# Patient Record
Sex: Male | Born: 1945 | Race: White | Hispanic: No | Marital: Married | Smoking: Former smoker
Health system: Southern US, Community
[De-identification: ages and names within clinical notes are randomized; demographics above are authoritative.]

## PROBLEM LIST (undated history)

## (undated) DIAGNOSIS — Z978 Presence of other specified devices: Secondary | ICD-10-CM

## (undated) DIAGNOSIS — E785 Hyperlipidemia, unspecified: Secondary | ICD-10-CM

## (undated) DIAGNOSIS — N400 Enlarged prostate without lower urinary tract symptoms: Secondary | ICD-10-CM

## (undated) DIAGNOSIS — K219 Gastro-esophageal reflux disease without esophagitis: Secondary | ICD-10-CM

## (undated) DIAGNOSIS — I10 Essential (primary) hypertension: Secondary | ICD-10-CM

## (undated) DIAGNOSIS — M1990 Unspecified osteoarthritis, unspecified site: Secondary | ICD-10-CM

## (undated) DIAGNOSIS — R972 Elevated prostate specific antigen [PSA]: Secondary | ICD-10-CM

## (undated) DIAGNOSIS — Z960 Presence of urogenital implants: Secondary | ICD-10-CM

## (undated) DIAGNOSIS — R339 Retention of urine, unspecified: Secondary | ICD-10-CM

## (undated) HISTORY — PX: INGUINAL HERNIA REPAIR: SUR1180

---

## 2001-02-28 ENCOUNTER — Emergency Department (HOSPITAL_COMMUNITY): Admission: EM | Admit: 2001-02-28 | Discharge: 2001-02-28 | Payer: Self-pay | Admitting: Emergency Medicine

## 2002-04-02 ENCOUNTER — Ambulatory Visit (HOSPITAL_COMMUNITY): Admission: RE | Admit: 2002-04-02 | Discharge: 2002-04-02 | Payer: Self-pay | Admitting: Internal Medicine

## 2002-11-30 ENCOUNTER — Other Ambulatory Visit: Admission: RE | Admit: 2002-11-30 | Discharge: 2002-11-30 | Payer: Self-pay | Admitting: Dermatology

## 2005-01-21 ENCOUNTER — Other Ambulatory Visit: Admission: RE | Admit: 2005-01-21 | Discharge: 2005-01-21 | Payer: Self-pay | Admitting: Dermatology

## 2014-07-15 HISTORY — PX: FOOT SURGERY: SHX648

## 2014-07-15 HISTORY — PX: TOTAL HIP ARTHROPLASTY: SHX124

## 2016-07-09 HISTORY — PX: TOTAL KNEE ARTHROPLASTY: SHX125

## 2016-08-26 ENCOUNTER — Other Ambulatory Visit: Payer: Self-pay | Admitting: Urology

## 2016-08-29 ENCOUNTER — Other Ambulatory Visit (HOSPITAL_COMMUNITY): Payer: Self-pay | Admitting: Internal Medicine

## 2016-08-29 DIAGNOSIS — N400 Enlarged prostate without lower urinary tract symptoms: Secondary | ICD-10-CM

## 2016-09-03 ENCOUNTER — Encounter (HOSPITAL_BASED_OUTPATIENT_CLINIC_OR_DEPARTMENT_OTHER): Payer: Self-pay | Admitting: *Deleted

## 2016-09-03 NOTE — Progress Notes (Signed)
SPOKE W/ PT'S WIFE.  NPO AFTER MN.  ARRIVE AT 0830.  NEEDS ISTAT AND EKG.  WILL TAKE ATENOLOL AM DOS W/ SIPS OF WATER.

## 2016-09-09 NOTE — H&P (Signed)
Office Visit Report 08/21/2016    Benjamin Mayer         MRN: [id number]  PRIMARY CARE:  Benjamin Mayer. Lajuana Ripple, MD  DOB: [birth year], 71 year old Male  REFERRING:  [name], NP  SSN: -[id number]  PROVIDER:  Jerilee Field, M.D.    LOCATION:  [facility] [phone]    CC: I have urinary retention.  HPI: Benjamin Mayer is a 71 year-old male established patient who is here for urinary retention.  His problem was diagnosed 07/09/2016. His current symptoms did begin after he had a surgical procedure. The surgery he had done was Knee replacement. His urinary retention is being treated with foley catheter and flomax. Patient denies suprapubic tube, intemittent catheterization, hytrin, cardura, uroxatrol, rapaflo, avodart, and proscar.   Denies constipation. Remains on Tamsulsoin 0.4 mg 2 po daily. UDS showed a bladder capacity of 210 cc. He had bladder outlet obstruction on voiding with an quiet EMG. Urine culture did grow Klebsiella sensitive to Cipro. He does have a Foley.   He returns for cysto/void trial, but also to discuss management of retention and BPH.     CC: BPH  HPI: The patient complains of lower urinary tract symptom(s) that include frequency and nocturia. His symptoms have been stable over the last year. He denies any other associated symptoms.   Pt with 70 g prostate in 2010 TRUS. Prostate 98 grams in 2016.      CC: Elevated PSA-Established Patient  HPI: His last PSA was performed 06/18/2016. The last PSA value was 13.9. The patient states he does not take 5 alpha reductase inhibitor medication.   He has had a prostate biopsy done. Patient does not have a family history of prostate cancer.   Elevated PSA and prostate biopsy:  -Aug 2008 PSA 4.8  -Sep 2009 PSA 6.3  -Jul 2010 PSA 8.1  -Sept 2010 TRUS Prostate biopsy, 70 g prostate, benign with acute and chronic inflammation  -May 2011 PSA 6.38  -Nov 2012 PSA 5.24  --May 2014 PSA 7.15, with 25% free  --May  2015 PSA 8.77, normal DRE  --Aug 2015 PSA 8.11, 25% free  --Mar 2016 PSA 8.41, 23% free  --Jul 2016 prostate 98 grams, PSA 8.48 with 24% free  --Mar 2017 PSA 9.32 with 27% free; normal DRE  --Jun 2017 7.24   Planned f/u after knee replacement, but pt went into retention.     ALLERGIES: Penicillins - Vomiting, Nausea    MEDICATIONS: Aspirin 81 MG TABS 2 Oral Daily  Atenolol 25 mg tablet Oral  Celecoxib  Simvastatin 20 mg tablet Oral  Tylenol Extra Strength 2 PO Q 6 H     GU PSH: Complex cystometrogram, w/ void pressure and urethral pressure profile studies, any technique - 08/05/2016 Complex Uroflow - 08/05/2016 Emg surf Electrd - 08/05/2016 Inject For cystogram - 08/05/2016 Intrabd voidng Press - 08/05/2016    NON-GU PSH: Extensive Foot Surgery Hernia Repair Total Hip Replacement - 01/19/2015 Total Knee Replacement, Right - 07/09/2016        GU PMH: Urinary Retention (Worsening, Chronic), Will remain on Tamsulosin 0.4 mg 2 po daily. Instructed to push po fluids. If unable to void in 6 hrs RTC for PVR and possible foley replacement vs CIC teaching. Pt returned this pm unable to void. Pt does not want to be taught CIC. Will replace foley and proceed with UDS and cysto w/Dr. Mena Goes - 07/16/2016 BPH w/o LUTS - 06/25/2016, BPH without obstruction/lower  urinary tract symptoms, - 01/19/2015 Elevated PSA - 06/25/2016, - 12/25/2015, Elevated prostate specific antigen (PSA), - 09/19/2015 BPH w/LUTS - 12/25/2015, Benign prostatic hyperplasia with urinary obstruction, - 09/19/2015 Urinary Frequency, Increased urinary frequency - 09/19/2015 Inflammatory Disease Prostate, Unspec, Prostatitis - 2014 Urinary Tract Inf, Unspec site, Pyuria - 2014    NON-GU PMH: Encounter for general adult medical examination without abnormal findings, Encounter for preventive health examination - 09/19/2015 Personal history of other diseases of the circulatory system, History of hypertension - 2014 Personal history of other  endocrine, nutritional and metabolic disease, History of hypercholesterolemia - 2014    FAMILY HISTORY: Acute Myocardial Infarction - Mother Aortic Aneurysm - Father Breast Cancer - Mother Death In The Family Father - Father Death In The Family Mother - Mother Family Health Status Number - Runs In Family Heart Disease - Father Hypertension - Mother   SOCIAL HISTORY: Marital Status: Married Current Smoking Status: Patient does not smoke anymore. Has not smoked since 07/16/1978.  <DIV'  Tobacco Use Assessment Completed:  Used Tobacco in last 30 days?   Has never drank.  Drinks 2 caffeinated drinks per day.    REVIEW OF SYSTEMS:     GU Review Male:  Patient denies frequent urination, hard to postpone urination, burning/ pain with urination, get up at night to urinate, leakage of urine, stream starts and stops, trouble starting your stream, have to strain to urinate , erection problems, and penile pain.    Gastrointestinal (Upper):  Patient denies nausea, vomiting, and indigestion/ heartburn.    Gastrointestinal (Lower):  Patient denies diarrhea and constipation.    Constitutional:  Patient denies fever, night sweats, weight loss, and fatigue.    Skin:  Patient denies skin rash/ lesion and itching.    Eyes:  Patient denies blurred vision and double vision.    Ears/ Nose/ Throat:  Patient denies sore throat and sinus problems.    Hematologic/Lymphatic:  Patient denies swollen glands and easy bruising.    Cardiovascular:  Patient denies leg swelling and chest pains.    Respiratory:  Patient denies cough and shortness of breath.    Endocrine:  Patient denies excessive thirst.    Musculoskeletal:  Patient denies back pain and joint pain.    Neurological:  Patient denies headaches and dizziness.    Psychologic:  Patient denies depression and anxiety.    VITAL SIGNS: None     MULTI-SYSTEM PHYSICAL EXAMINATION:      Constitutional: Well-nourished. No physical deformities. Normally developed.  Good grooming.     Neck: Neck symmetrical, not swollen. Normal tracheal position.     Respiratory: No labored breathing, no use of accessory muscles.      Cardiovascular: Normal temperature, normal extremity pulses, no swelling, no varicosities.     Skin: No paleness, no jaundice, no cyanosis. No lesion, no ulcer, no rash.     Neurologic / Psychiatric: Oriented to time, oriented to place, oriented to person. No depression, no anxiety, no agitation.            PAST DATA REVIEWED:   X-Ray Review: Prostate Ultrasound: Reviewed Films.      06/18/16 12/18/15 09/13/15 01/13/15 09/13/14 02/19/14 11/20/13 11/20/12  PSA  Total PSA 13.90  7.24  9.32  8.48  8.41  8.11  8.77  7.15   Free PSA  1.72  2.51  2.01  1.97  2.02   1.77   % Free PSA  [id number]  27  24  23  25    25  PROCEDURES:    Flexible Cystoscopy - 52000  Risks, benefits, and some of the potential complications of the procedure were discussed with the patient. All questions were answered. Informed consent was obtained. Antibiotic prophylaxis was given -- Cipro. Sterile technique and intraurethral analgesia were used.  Meatus:  Normal size. Normal location. Normal condition.  Urethra:  No strictures.  External Sphincter:  Normal.  Verumontanum:  Normal.  Prostate:  Obstructing. Moderate hyperplasia.  Bladder Neck:  Non-obstructing.  Ureteral Orifices:  Normal location. Normal size. Normal shape. Effluxed clear urine.  Bladder:  Mild trabeculation. Erythematous mucosa - posterior. No tumors. No stones.      The lower urinary tract was carefully examined. The procedure was well-tolerated and without complications. Antibiotic instructions were given. Instructions were given to call the office immediately for bloody urine, difficulty urinating, painful urination, fever, chills, nausea, vomiting or other illness. The patient stated that he understood these instructions and would comply with them.    Notes:  filled with 200  ml; he voided 100 ml with a slow flow.    ASSESSMENT:     ICD-10 Details  1 GU:  Urinary Retention, Unspec - R33.9   2  BPH w/o LUTS - N40.0   3  Elevated PSA - R97.20    PLAN:   Schedule  Return Visit/Planned Activity: Keep Scheduled Appointment - Office Visit, Schedule Surgery  Document  Letter(s):  Created for Patient: Clinical Summary   Notes:  BPH - on tamsulosin - discussed 5ARI addition. We also discussed TURP, laser tx's and PAE. Discussed I discussed llaser vaporization of prostate including side effects of the procedure, expected post-op course and likelihood of success. We discussed flow symptoms and irritative symptoms typically improve, but frequency and urgency can persist and rarely worsen. We also discussed risk of bleeding, infection, stricture, sexual dysfunction and incontinence among others. All questions answered. He elects to proceed.   retention - void trial equivocal. Discussed CIC. He said no. He's been asked twice.   PSA - elevation - we'll add a biopsy.   cc: Dr. Lajuana Ripple    * Signed by Jerilee Field, M.D. on 08/21/16 at 8:56 PM (EST)*   The information contained in this medical record document is considered private and confidential patient information. This information can only be used for the medical diagnosis and/or medical services that are being provided by the patient's selected caregivers. This information can only be distributed outside of the patient's care if the patient agrees and signs waivers of authorization for this information to be sent to an outside source or route.  Add: Pt went back to ED in retention and foley replaced. Will start Levaquin 2/26.

## 2016-09-10 ENCOUNTER — Ambulatory Visit (HOSPITAL_BASED_OUTPATIENT_CLINIC_OR_DEPARTMENT_OTHER)
Admission: RE | Admit: 2016-09-10 | Discharge: 2016-09-10 | Disposition: A | Discharge status: Home / Self Care | Payer: Medicare Other | Source: Ambulatory Visit | Attending: Urology | Admitting: Urology

## 2016-09-10 ENCOUNTER — Ambulatory Visit (HOSPITAL_BASED_OUTPATIENT_CLINIC_OR_DEPARTMENT_OTHER): Payer: Medicare Other | Admitting: Anesthesiology

## 2016-09-10 ENCOUNTER — Ambulatory Visit (HOSPITAL_COMMUNITY)
Admission: RE | Admit: 2016-09-10 | Discharge: 2016-09-10 | Disposition: A | Discharge status: Home / Self Care | Payer: Medicare Other | Source: Ambulatory Visit | Attending: Internal Medicine | Admitting: Internal Medicine

## 2016-09-10 ENCOUNTER — Encounter (HOSPITAL_BASED_OUTPATIENT_CLINIC_OR_DEPARTMENT_OTHER)
Admission: RE | Disposition: A | Discharge status: Home / Self Care | Payer: Self-pay | Source: Ambulatory Visit | Attending: Urology

## 2016-09-10 ENCOUNTER — Encounter (HOSPITAL_BASED_OUTPATIENT_CLINIC_OR_DEPARTMENT_OTHER): Payer: Self-pay | Admitting: Certified Registered"

## 2016-09-10 DIAGNOSIS — Z87891 Personal history of nicotine dependence: Secondary | ICD-10-CM | POA: Insufficient documentation

## 2016-09-10 DIAGNOSIS — R972 Elevated prostate specific antigen [PSA]: Secondary | ICD-10-CM | POA: Diagnosis not present

## 2016-09-10 DIAGNOSIS — Z7982 Long term (current) use of aspirin: Secondary | ICD-10-CM | POA: Insufficient documentation

## 2016-09-10 DIAGNOSIS — Z888 Allergy status to other drugs, medicaments and biological substances status: Secondary | ICD-10-CM | POA: Diagnosis not present

## 2016-09-10 DIAGNOSIS — N401 Enlarged prostate with lower urinary tract symptoms: Secondary | ICD-10-CM | POA: Diagnosis present

## 2016-09-10 DIAGNOSIS — Z880 Allergy status to penicillin: Secondary | ICD-10-CM | POA: Diagnosis not present

## 2016-09-10 DIAGNOSIS — Z79899 Other long term (current) drug therapy: Secondary | ICD-10-CM | POA: Insufficient documentation

## 2016-09-10 DIAGNOSIS — R338 Other retention of urine: Secondary | ICD-10-CM | POA: Insufficient documentation

## 2016-09-10 DIAGNOSIS — I10 Essential (primary) hypertension: Secondary | ICD-10-CM | POA: Diagnosis not present

## 2016-09-10 DIAGNOSIS — N400 Enlarged prostate without lower urinary tract symptoms: Secondary | ICD-10-CM

## 2016-09-10 DIAGNOSIS — Z886 Allergy status to analgesic agent status: Secondary | ICD-10-CM | POA: Insufficient documentation

## 2016-09-10 HISTORY — DX: Unspecified osteoarthritis, unspecified site: M19.90

## 2016-09-10 HISTORY — DX: Presence of other specified devices: Z97.8

## 2016-09-10 HISTORY — DX: Essential (primary) hypertension: I10

## 2016-09-10 HISTORY — DX: Gastro-esophageal reflux disease without esophagitis: K21.9

## 2016-09-10 HISTORY — DX: Retention of urine, unspecified: R33.9

## 2016-09-10 HISTORY — DX: Benign prostatic hyperplasia without lower urinary tract symptoms: N40.0

## 2016-09-10 HISTORY — DX: Hyperlipidemia, unspecified: E78.5

## 2016-09-10 HISTORY — DX: Elevated prostate specific antigen (PSA): R97.20

## 2016-09-10 HISTORY — PX: THULIUM LASER TURP (TRANSURETHRAL RESECTION OF PROSTATE): SHX6744

## 2016-09-10 HISTORY — PX: PROSTATE BIOPSY: SHX241

## 2016-09-10 HISTORY — DX: Presence of urogenital implants: Z96.0

## 2016-09-10 LAB — POCT I-STAT 4, (NA,K, GLUC, HGB,HCT)
GLUCOSE: 90 mg/dL (ref 65–99)
HEMATOCRIT: 57 % — AB (ref 39.0–52.0)
Hemoglobin: 19.4 g/dL — ABNORMAL HIGH (ref 13.0–17.0)
Potassium: 4.4 mmol/L (ref 3.5–5.1)
SODIUM: 140 mmol/L (ref 135–145)

## 2016-09-10 SURGERY — THULIUM LASER TURP (TRANSURETHRAL RESECTION OF PROSTATE)
Anesthesia: General | Site: Prostate

## 2016-09-10 MED ORDER — CEPHALEXIN 500 MG PO CAPS: 500.0000 mg | ORAL_CAPSULE | Freq: Every day | ORAL | 0 refills | Status: AC

## 2016-09-10 MED ORDER — MIDAZOLAM HCL 5 MG/5ML IJ SOLN
INTRAMUSCULAR | Status: DC | PRN
  Administered 2016-09-10 (×2): 1 mg via INTRAVENOUS

## 2016-09-10 MED ORDER — LACTATED RINGERS IV SOLN
INTRAVENOUS | Status: DC
  Administered 2016-09-10: 09:00:00 via INTRAVENOUS
  Filled 2016-09-10: qty 1000

## 2016-09-10 MED ORDER — PROMETHAZINE HCL 25 MG/ML IJ SOLN
6.2500 mg | INTRAMUSCULAR | Status: DC | PRN
  Filled 2016-09-10: qty 1

## 2016-09-10 MED ORDER — FENTANYL CITRATE (PF) 100 MCG/2ML IJ SOLN
INTRAMUSCULAR | Status: AC
  Filled 2016-09-10: qty 2

## 2016-09-10 MED ORDER — BELLADONNA ALKALOIDS-OPIUM 16.2-60 MG RE SUPP
RECTAL | Status: DC | PRN
  Administered 2016-09-10: 1 via RECTAL

## 2016-09-10 MED ORDER — DEXAMETHASONE SODIUM PHOSPHATE 4 MG/ML IJ SOLN
INTRAMUSCULAR | Status: DC | PRN
  Administered 2016-09-10: 10 mg via INTRAVENOUS

## 2016-09-10 MED ORDER — ACETAMINOPHEN 10 MG/ML IV SOLN
INTRAVENOUS | Status: AC
  Filled 2016-09-10: qty 100

## 2016-09-10 MED ORDER — ASPIRIN EC 81 MG PO TBEC: 81.0000 mg | DELAYED_RELEASE_TABLET | Freq: Every morning | ORAL | Status: AC

## 2016-09-10 MED ORDER — HYDROMORPHONE HCL 1 MG/ML IJ SOLN
0.2500 mg | INTRAMUSCULAR | Status: DC | PRN
  Filled 2016-09-10: qty 0.5

## 2016-09-10 MED ORDER — LIDOCAINE 2% (20 MG/ML) 5 ML SYRINGE
INTRAMUSCULAR | Status: AC
  Filled 2016-09-10: qty 5

## 2016-09-10 MED ORDER — DEXAMETHASONE SODIUM PHOSPHATE 10 MG/ML IJ SOLN
INTRAMUSCULAR | Status: AC
  Filled 2016-09-10: qty 1

## 2016-09-10 MED ORDER — ONDANSETRON HCL 4 MG/2ML IJ SOLN
INTRAMUSCULAR | Status: AC
  Filled 2016-09-10: qty 2

## 2016-09-10 MED ORDER — KETOROLAC TROMETHAMINE 30 MG/ML IJ SOLN
30.0000 mg | Freq: Once | INTRAMUSCULAR | Status: DC | PRN
Start: 2016-09-10 — End: 2016-09-10
  Filled 2016-09-10: qty 1

## 2016-09-10 MED ORDER — TRAMADOL HCL 50 MG PO TABS: 50.0000 mg | ORAL_TABLET | Freq: Four times a day (QID) | ORAL | 0 refills | Status: AC | PRN

## 2016-09-10 MED ORDER — ONDANSETRON HCL 4 MG/2ML IJ SOLN
INTRAMUSCULAR | Status: DC | PRN
  Administered 2016-09-10: 4 mg via INTRAVENOUS

## 2016-09-10 MED ORDER — CEFAZOLIN SODIUM-DEXTROSE 2-4 GM/100ML-% IV SOLN
INTRAVENOUS | Status: AC
  Filled 2016-09-10: qty 100

## 2016-09-10 MED ORDER — FENTANYL CITRATE (PF) 100 MCG/2ML IJ SOLN
INTRAMUSCULAR | Status: DC | PRN
  Administered 2016-09-10 (×2): 25 ug via INTRAVENOUS
  Administered 2016-09-10: 50 ug via INTRAVENOUS
  Administered 2016-09-10 (×4): 25 ug via INTRAVENOUS

## 2016-09-10 MED ORDER — ACETAMINOPHEN 10 MG/ML IV SOLN
INTRAVENOUS | Status: DC | PRN
  Administered 2016-09-10: 1000 mg via INTRAVENOUS

## 2016-09-10 MED ORDER — MIDAZOLAM HCL 2 MG/2ML IJ SOLN
INTRAMUSCULAR | Status: AC
  Filled 2016-09-10: qty 2

## 2016-09-10 MED ORDER — EPHEDRINE SULFATE-NACL 50-0.9 MG/10ML-% IV SOSY
PREFILLED_SYRINGE | INTRAVENOUS | Status: DC | PRN
  Administered 2016-09-10: 10 mg via INTRAVENOUS

## 2016-09-10 MED ORDER — SODIUM CHLORIDE 0.9 % IR SOLN
Status: DC | PRN
  Administered 2016-09-10: 15000 mL

## 2016-09-10 MED ORDER — GLYCOPYRROLATE 0.2 MG/ML IJ SOLN
INTRAMUSCULAR | Status: DC | PRN
  Administered 2016-09-10: 0.2 mg via INTRAVENOUS

## 2016-09-10 MED ORDER — CEFAZOLIN SODIUM-DEXTROSE 2-4 GM/100ML-% IV SOLN
2.0000 g | INTRAVENOUS | Status: AC
  Administered 2016-09-10: 2 g via INTRAVENOUS
  Filled 2016-09-10: qty 100

## 2016-09-10 MED ORDER — LIDOCAINE HCL (CARDIAC) 20 MG/ML IV SOLN
INTRAVENOUS | Status: DC | PRN
  Administered 2016-09-10: 60 mg via INTRAVENOUS

## 2016-09-10 MED ORDER — PROPOFOL 10 MG/ML IV BOLUS
INTRAVENOUS | Status: DC | PRN
  Administered 2016-09-10: 40 mg via INTRAVENOUS
  Administered 2016-09-10: 160 mg via INTRAVENOUS

## 2016-09-10 MED ORDER — PROPOFOL 10 MG/ML IV BOLUS
INTRAVENOUS | Status: AC
  Filled 2016-09-10: qty 40

## 2016-09-10 SURGICAL SUPPLY — 39 items
BAG DRAIN URO-CYSTO SKYTR STRL (DRAIN) ×3 IMPLANT
BAG DRN UROCATH (DRAIN) ×1
BAG URINE DRAINAGE (UROLOGICAL SUPPLIES) ×3 IMPLANT
CATH COUDE FOLEY 2W 5CC 18FR (CATHETERS) ×2 IMPLANT
CATH FOLEY 2WAY SLVR  5CC 18FR (CATHETERS)
CATH FOLEY 2WAY SLVR 5CC 18FR (CATHETERS) IMPLANT
CATH FOLEY 3WAY 30CC 22F (CATHETERS) IMPLANT
CLOTH BEACON ORANGE TIMEOUT ST (SAFETY) ×3 IMPLANT
DRSG TELFA 3X8 NADH (GAUZE/BANDAGES/DRESSINGS) IMPLANT
ELECT BIVAP BIPO 22/24 DONUT (ELECTROSURGICAL)
ELECT LOOP MED HF 24F 12D (CUTTING LOOP) IMPLANT
ELECTRD BIVAP BIPO 22/24 DONUT (ELECTROSURGICAL) IMPLANT
GLOVE BIO SURGEON STRL SZ7.5 (GLOVE) ×3 IMPLANT
GLOVE SURG SS PI 8.0 STRL IVOR (GLOVE) ×2 IMPLANT
GOWN STRL REUS W/ TWL XL LVL3 (GOWN DISPOSABLE) ×1 IMPLANT
GOWN STRL REUS W/TWL XL LVL3 (GOWN DISPOSABLE) ×3
HOLDER FOLEY CATH W/STRAP (MISCELLANEOUS) IMPLANT
INST BIOPSY MAXCORE 18GX25 (NEEDLE) IMPLANT
INSTR BIOPSY MAXCORE 18GX20 (NEEDLE) ×2 IMPLANT
IV NS IRRIG 3000ML ARTHROMATIC (IV SOLUTION) ×3 IMPLANT
IV SET EXTENSION GRAVITY 40 LF (IV SETS) ×3 IMPLANT
KIT RM TURNOVER CYSTO AR (KITS) ×3 IMPLANT
LASER REVOLIX PROCEDURE (MISCELLANEOUS) ×3 IMPLANT
LOOP CUT BIPOLAR 24F LRG (ELECTROSURGICAL) IMPLANT
MANIFOLD NEPTUNE II (INSTRUMENTS) IMPLANT
NDL SAFETY ECLIPSE 18X1.5 (NEEDLE) IMPLANT
NDL SPNL 22GX7 QUINCKE BK (NEEDLE) IMPLANT
NEEDLE HYPO 18GX1.5 SHARP (NEEDLE)
NEEDLE SPNL 22GX7 QUINCKE BK (NEEDLE) IMPLANT
PACK CYSTO (CUSTOM PROCEDURE TRAY) ×3 IMPLANT
PAD DRESSING TELFA 3X8 NADH (GAUZE/BANDAGES/DRESSINGS) IMPLANT
SURGILUBE 2OZ TUBE FLIPTOP (MISCELLANEOUS) ×2 IMPLANT
SYR 30ML LL (SYRINGE) IMPLANT
SYR CONTROL 10ML LL (SYRINGE) IMPLANT
SYRINGE IRR TOOMEY STRL 70CC (SYRINGE) IMPLANT
TOWEL OR 17X24 6PK STRL BLUE (TOWEL DISPOSABLE) ×2 IMPLANT
TUBE CONNECTING 12'X1/4 (SUCTIONS)
TUBE CONNECTING 12X1/4 (SUCTIONS) IMPLANT
UNDERPAD 30X30 INCONTINENT (UNDERPADS AND DIAPERS) ×3 IMPLANT

## 2016-09-10 NOTE — Anesthesia Postprocedure Evaluation (Addendum)
Anesthesia Post Note  Patient: Benjamin Mayer  Procedure(s) Performed: Procedure(s) (LRB): THULIUM LASER TURP (TRANSURETHRAL RESECTION OF PROSTATE) (N/A) BIOPSY TRANSRECTAL ULTRASONIC PROSTATE (TUBP) (N/A)  Patient location during evaluation: PACU Anesthesia Type: General Level of consciousness: awake and alert Pain management: pain level controlled Vital Signs Assessment: post-procedure vital signs reviewed and stable Respiratory status: spontaneous breathing, nonlabored ventilation, respiratory function stable and patient connected to nasal cannula oxygen Cardiovascular status: blood pressure returned to baseline and stable Postop Assessment: no signs of nausea or vomiting Anesthetic complications: no       Last Vitals:  Vitals:   09/10/16 0831 09/10/16 1145  BP: (!) 157/88 (!) 149/71  Pulse: (!) 55 (!) 56  Resp: 16 14  Temp: 36.3 C 36.4 C    Last Pain:  Vitals:   09/10/16 0831  TempSrc: Oral                 Willadeen Colantuono S

## 2016-09-10 NOTE — Anesthesia Preprocedure Evaluation (Addendum)
Anesthesia Evaluation  Patient identified by MRN, date of birth, ID band Patient awake    Reviewed: Allergy & Precautions, NPO status , Patient's Chart, lab work & pertinent test results  Airway Mallampati: II  TM Distance: >3 FB Neck ROM: Full    Dental no notable dental hx.    Pulmonary neg pulmonary ROS, former smoker,    Pulmonary exam normal breath sounds clear to auscultation       Cardiovascular hypertension, Normal cardiovascular exam Rhythm:Regular Rate:Normal     Neuro/Psych Wears leg brace, uses cane occasionally negative psych ROS   GI/Hepatic negative GI ROS, Neg liver ROS,   Endo/Other  negative endocrine ROS  Renal/GU negative Renal ROS  negative genitourinary   Musculoskeletal negative musculoskeletal ROS (+)   Abdominal   Peds negative pediatric ROS (+)  Hematology negative hematology ROS (+)   Anesthesia Other Findings   Reproductive/Obstetrics negative OB ROS                            Anesthesia Physical Anesthesia Plan  ASA: II  Anesthesia Plan: General   Post-op Pain Management:    Induction: Intravenous  Airway Management Planned: LMA  Additional Equipment:   Intra-op Plan:   Post-operative Plan: Extubation in OR  Informed Consent: I have reviewed the patients History and Physical, chart, labs and discussed the procedure including the risks, benefits and alternatives for the proposed anesthesia with the patient or authorized representative who has indicated his/her understanding and acceptance.   Dental advisory given  Plan Discussed with: CRNA and Surgeon  Anesthesia Plan Comments:         Anesthesia Quick Evaluation

## 2016-09-10 NOTE — Anesthesia Procedure Notes (Signed)
Procedure Name: LMA Insertion Date/Time: 09/10/2016 10:08 AM Performed by: Jessica PriestBEESON, Benjamin Mayer-anesthesia Checklist: Patient identified, Emergency Drugs available, Suction available and Patient being monitored Patient Re-evaluated:Patient Re-evaluated prior to inductionOxygen Delivery Method: Circle system utilized Preoxygenation: Mayer-oxygenation with 100% oxygen Intubation Type: IV induction Ventilation: Mask ventilation without difficulty LMA: LMA inserted LMA Size: 5.0 Number of attempts: 1 Airway Equipment and Method: Bite block Placement Confirmation: positive ETCO2 and breath sounds checked- equal and bilateral Tube secured with: Tape Dental Injury: Teeth and Oropharynx as per Mayer-operative assessment

## 2016-09-10 NOTE — Op Note (Signed)
Preoperative diagnosis: BPH, elevated PSA, urinary retention Postoperative diagnosis: Same  Procedure: Cystoscopy, thulium laser vaporization of the prostate, Transrectal ultrasound of prostate, Transrectal ultrasound-guided prostate biopsy  Surgeon: Mena GoesEskridge  Anesthesia: Gen.  Indication for procedure: 71 year old with known BPH and urinary retention. He failed multiple voiding trials. His PSA was around 11. He was brought for thulium laser vaporization of the prostate and prostate biopsy.  Findings: On exam under anesthesia the penis was circumcised with a normal foreskin. Testicles were descended bilaterally and palpably normal. On DRE the prostate was smooth without hard area or nodule.  On cystoscopy there was obstructing lateral lobe hypertrophy. There was mild trabeculation of the bladder. There were no stones or foreign bodies in the bladder. The trigone and ureteral orifices were in their normal orthotopic position. The mucosa of the bladder appeared normal except posteriorly there was some erythema from the catheter. There were no papillary tumors in the bladder.  Transrectal ultrasound: The seminal vesicles and prostate appeared normal. There were no significant hypoechoic areas. The prostate measured 71.8 g.  Description of procedure: After consent was obtained patient brought to the operating room. After adequate anesthesia she was placed in lithotomy position and prepped and draped in the usual sterile fashion. A timeout was performed to confirm the patient and procedure. An exam under anesthesia was performed. I then passed the cystoscope per urethra and inspected the bladder. The continuous-flow laser sheath was then passed. The ureteral orifices were identified and made incisions about o'clock and 7:00 and brought these down to the veru. There was not a significant median lobe. I left the bladder neck and posterior prostate in the midline intact between the incisions. I then  worked my way from bladder neck anterior to posterior down to the apex and back. I started on the right and then the left. I used the side-firing thulium laser. This created an excellent channel. Hemostasis was excellent at low-pressure. The bladder was refilled. The ureteral orifices were again inspected. they were normal. The ureteral orifices and veru were used as landmarks strap the case. In no vaporization was carried proximal or distal to these landmarks. The scope was removed and an 4218 French Foley catheter was placed. A put 17.5 mL in the balloon. It was left gravity drainage. The urine was clear.  The transrectal ultrasound probe was inserted per rectum and the ultrasound of the prostate was obtained. The ultrasound was then used to obtain a 12 core biopsy, right, left, base mid to apex, lateral to medial. I irrigated the catheter and the urine remained clear. I then placed a B&O suppository. Hemostasis was excellent. There was minimal rectal bleeding. The patient was awakened and taken to the recovery room in stable condition.  Complications: None  Blood loss: 25 mL  Specimens: Prostate biopsy, 12 core, to pathology  Drains: 18 French coud catheter

## 2016-09-10 NOTE — Interval H&P Note (Signed)
History and Physical Interval Note:  09/10/2016 9:49 AM  Benjamin Mayer  has presented today for surgery, with the diagnosis of BENIGN PROSTATE HYPERPLASIA  The various methods of treatment have been discussed with the patient and family. After consideration of risks, benefits and other options for treatment, the patient has consented to  Procedure(s): THULIUM LASER TURP (TRANSURETHRAL RESECTION OF PROSTATE) (N/A) BIOPSY TRANSRECTAL ULTRASONIC PROSTATE (TUBP) (N/A) as a surgical intervention .  The patient's history has been reviewed, patient examined, no change in status, stable for surgery. He recalls taking PCN as a child for "tonsilitis" and "throwing it up". No trouble breathing, rash, etc. We discussed cross reactivity with cefazolin and he elected to proceed with it. We also discussed he has a large prostate and might need a staged procedure in order to remove enough prostate tissue that he can void. I have reviewed the patient's chart and labs.  Questions were answered to the patient's and family's satisfaction.  He did take a levaquin last night and this AM.    [place]

## 2016-09-10 NOTE — Discharge Instructions (Signed)
Indwelling Urinary Catheter Care, Adult Take good care of your catheter to keep it working and to prevent problems. How to wear your catheter Attach your catheter to your leg with tape (adhesive tape) or a leg strap. Make sure it is not too tight. If you use tape, remove any bits of tape that are already on the catheter. How to wear a drainage bag You should have:  A large overnight bag.  A small leg bag. Overnight Bag  You may wear the overnight bag at any time. Always keep the bag below the level of your bladder but off the floor. When you sleep, put a clean plastic bag in a wastebasket. Then hang the bag inside the wastebasket. Leg Bag  Never wear the leg bag at night. Always wear the leg bag below your knee. Keep the leg bag secure with a leg strap or tape. How to care for your skin  Clean the skin around the catheter at least once every day.  Shower every day. Do not take baths.  Put creams, lotions, or ointments on your genital area only as told by your doctor.  Do not use powders, sprays, or lotions on your genital area. How to clean your catheter and your skin 1. Wash your hands with soap and water. 2. Wet a washcloth in warm water and gentle (mild) soap. 3. Use the washcloth to clean the skin where the catheter enters your body. Clean downward and wipe away from the catheter in small circles. Do not wipe toward the catheter. 4. Pat the area dry with a clean towel. Make sure to clean off all soap. How to care for your drainage bags Empty your drainage bag when it is ?- full or at least 2-3 times a day. Replace your drainage bag once a month or sooner if it starts to smell bad or look dirty. Do not clean your drainage bag unless told by your doctor. Emptying a drainage bag   Supplies Needed  Rubbing alcohol.  Gauze pad or cotton ball.  Tape or a leg strap. Steps 1. Wash your hands with soap and water. 2. Separate (detach) the bag from your leg. 3. Hold the bag over  the toilet or a clean container. Keep the bag below your hips and bladder. This stops pee (urine) from going back into the tube. 4. Open the pour spout at the bottom of the bag. 5. Empty the pee into the toilet or container. Do not let the pour spout touch any surface. 6. Put rubbing alcohol on a gauze pad or cotton ball. 7. Use the gauze pad or cotton ball to clean the pour spout. 8. Close the pour spout. 9. Attach the bag to your leg with tape or a leg strap. 10. Wash your hands. Changing a drainage bag  Supplies Needed  Alcohol wipes.  A clean drainage bag.  Adhesive tape or a leg strap. Steps 1. Wash your hands with soap and water. 2. Separate the dirty bag from your leg. 3. Pinch the rubber catheter with your fingers so that pee does not spill out. 4. Separate the catheter tube from the drainage tube where these tubes connect (at the connection valve). Do not let the tubes touch any surface. 5. Clean the end of the catheter tube with an alcohol wipe. Use a different alcohol wipe to clean the end of the drainage tube. 6. Connect the catheter tube to the drainage tube of the clean bag. 7. Attach the new bag to  the leg with adhesive tape or a leg strap. 8. Wash your hands. How to prevent infection and other problems  Never pull on your catheter or try to remove it. Pulling can damage tissue in your body.  Always wash your hands before and after touching your catheter.  If a leg strap gets wet, replace it with a dry one.  Drink enough fluids to keep your pee clear or pale yellow, or as told by your doctor.  Do not let the drainage bag or tubing touch the floor.  Wear cotton underwear.  If you are male, wipe from front to back after you poop (have a bowel movement).  Check on the catheter often to make sure it works and the tubing is not twisted. Get help if:  Your pee is cloudy.  Your pee smells unusually bad.  Your pee is not draining into the bag.  Your tube  gets clogged.  Your catheter starts to leak.  Your bladder feels full. Get help right away if:  You have redness, swelling, or pain where the catheter enters your body.  You have fluid, pus, or a bad smell coming from the area where the catheter enters your body.  The area where the catheter enters your body feels warm.  You have a fever.  You have pain in your:  Stomach (abdomen).  Legs.  Lower back.  Bladder.  You see blood fill the catheter.  Your pee is pink or red.  You feel sick to your stomach (nauseous).  You throw up (vomit).  You have chills.  Your catheter gets pulled out. This information is not intended to replace advice given to you by your health care provider. Make sure you discuss any questions you have with your health care provider. Document Released: 10/26/2012 Document Revised: 05/29/2016 Document Reviewed: 12/14/2013 Elsevier Interactive Patient Education  2017 [employer]   Prostate Laser Surgery, Care After This sheet gives you information about how to care for yourself after your procedure. Your health care provider may also give you more specific instructions. If you have problems or questions, contact your health care provider. What can I expect after the procedure? For the first few weeks after the procedure:  You will feel a need to urinate often.  You may have blood in your urine.  You may feel a sudden need to urinate. Once your urinary catheter is removed, you may have a burning feeling when you urinate, especially at the end of urination. This feeling usually passes within 3-5 days. Follow these instructions at home: Activity   Return to your normal activities as told by your health care provider. Ask your health care provider what activities are safe for you.  Do not do vigorous exercise for 1 week or as told by your health care provider.  Do not lift anything that is heavier than 10 lb (4.5 kg) until your health care  provider say it is safe.  Avoid sexual activity for 4-6 weeks or as told by your health care provider.  Do not ride in a car for extended periods of time for 1 month or as told by your health care provider.  Do not drive for 24 hours if you were given a medicine to help you relax (sedative). Diet   Eat foods that are high in fiber, such as fresh fruits and vegetables, whole grains, and beans.  Drink enough fluid to keep your urine clear or pale yellow. Medicines   Take over-the-counter and prescription  medicines, including stool softeners, only as told by your health care provider.  If you were prescribed an antibiotic medicine, take it as told by your health care provider. Do not stop taking the antibiotic even if you start to feel better. General instructions   If you were given elastic support stockings, wear them as told by your health care provider.  Do not strain to have a bowel movement. Straining may lead to bleeding from the prostate and cause clots to form and cause trouble urinating.  Keep all follow-up visits as told by your health care provider. This is important. Contact a health care provider if:  You have a fever or chills.  You have spasms or pain with the urinary catheter still in place.  Once the catheter has been removed, you experience difficulty starting your stream when attempting to urinate. Get help right away if:  There is a blockage in your catheter.  Your catheter has been removed and you are suddenly unable to urinate.  Your urine smells unusually bad.  You start to have blood clots in your urine.  The blood in your urine becomes persistent or gets thick.  You develop chest pains.  You develop shortness of breath.  You develop swelling or pain in your leg. Summary  You may notice urinary symptoms for a few weeks after your procedure.  Follow instructions from your health care provider regarding activity restrictions such as lifting,  exercise, and sexual activity.  Contact your health care provider if you have any unusual symptoms during your recovery. This information is not intended to replace advice given to you by your health care provider. Make sure you discuss any questions you have with your health care provider. Document Released: 07/01/2005 Document Revised: 02/16/2016 Document Reviewed: 02/16/2016 Elsevier Interactive Patient Education  2017 [employer]  Post Anesthesia Home Care Instructions  Activity: Get plenty of rest for the remainder of the day. A responsible adult should stay with you for 24 hours following the procedure.  For the next 24 hours, DO NOT: -Drive a car -Advertising copywriter -Drink alcoholic beverages -Take any medication unless instructed by your physician -Make any legal decisions or sign important papers.  Meals: Start with liquid foods such as gelatin or soup. Progress to regular foods as tolerated. Avoid greasy, spicy, heavy foods. If nausea and/or vomiting occur, drink only clear liquids until the nausea and/or vomiting subsides. Call your physician if vomiting continues.  Special Instructions/Symptoms: Your throat may feel dry or sore from the anesthesia or the breathing tube placed in your throat during surgery. If this causes discomfort, gargle with warm salt water. The discomfort should disappear within 24 hours.  If you had a scopolamine patch placed behind your ear for the management of post- operative nausea and/or vomiting:  1. The medication in the patch is effective for 72 hours, after which it should be removed.  Wrap patch in a tissue and discard in the trash. Wash hands thoroughly with soap and water. 2. You may remove the patch earlier than 72 hours if you experience unpleasant side effects which may include dry mouth, dizziness or visual disturbances. 3. Avoid touching the patch. Wash your hands with soap and water after contact with the patch.

## 2016-09-10 NOTE — Transfer of Care (Signed)
Immediate Anesthesia Transfer of Care Note  Patient: Benjamin Mayer  Procedure(s) Performed: Procedure(s) (LRB): THULIUM LASER TURP (TRANSURETHRAL RESECTION OF PROSTATE) (N/A) BIOPSY TRANSRECTAL ULTRASONIC PROSTATE (TUBP) (N/A)  Patient Location: PACU  Anesthesia Type: General  Level of Consciousness: awake, sedated, patient cooperative and responds to stimulation  Airway & Oxygen Therapy: Patient Spontanous Breathing and Patient connected to face mask oxygen  Post-op Assessment: Report given to PACU RN, Post -op Vital signs reviewed and stable and Patient moving all extremities  Post vital signs: Reviewed and stable  Complications: No apparent anesthesia complications

## 2016-09-10 NOTE — Brief Op Note (Signed)
09/10/2016  11:36 AM  PATIENT:  Benjamin Mayer  71 y.o. male  PRE-OPERATIVE DIAGNOSIS:  BENIGN PROSTATE HYPERPLASIA  POST-OPERATIVE DIAGNOSIS:  BENIGN PROSTATE HYPERPLASIA  PROCEDURE:  Procedure(s): THULIUM LASER TURP (TRANSURETHRAL RESECTION OF PROSTATE) (N/A) BIOPSY TRANSRECTAL ULTRASONIC PROSTATE (TUBP) (N/A)  SURGEON:  Surgeon(s) and Role:    * Jerilee FieldMatthew Mariska Daffin, MD - Primary  PHYSICIAN ASSISTANT:   ASSISTANTS: none   ANESTHESIA:   general  EBL:  Total I/O In: 200 [I.V.:200] Out: 25 [Blood:25]  BLOOD ADMINISTERED:none  DRAINS: Urinary Catheter (Foley)  18Fr coude  LOCAL MEDICATIONS USED:  OTHER B&O gtt  SPECIMEN:  TRUS 12 core biopsy of prostate to pathology  DISPOSITION OF SPECIMEN:  PATHOLOGY  COUNTS:  YES  TOURNIQUET:  * No tourniquets in log *  DICTATION: .Dragon Dictation  PLAN OF CARE: Discharge to home after PACU  PATIENT DISPOSITION:  PACU - hemodynamically stable.   Delay start of Pharmacological VTE agent (>24hrs) due to surgical blood loss or risk of bleeding: yes

## 2016-09-10 NOTE — OR Nursing (Signed)
17.5 ml of water in coude catheter placed by md

## 2016-09-11 ENCOUNTER — Encounter (HOSPITAL_BASED_OUTPATIENT_CLINIC_OR_DEPARTMENT_OTHER): Payer: Self-pay | Admitting: Urology

## 2016-12-16 NOTE — Addendum Note (Signed)
Addendum  created 12/16/16 1138 by Aunesti Pellegrino, MD   Sign clinical note    

## 2018-03-23 ENCOUNTER — Other Ambulatory Visit: Payer: Self-pay | Admitting: Urology

## 2018-03-23 DIAGNOSIS — R972 Elevated prostate specific antigen [PSA]: Secondary | ICD-10-CM

## 2018-04-01 ENCOUNTER — Ambulatory Visit (HOSPITAL_COMMUNITY)
Admission: RE | Admit: 2018-04-01 | Discharge: 2018-04-01 | Disposition: A | Discharge status: Home / Self Care | Payer: Medicare Other | Source: Ambulatory Visit | Attending: Urology | Admitting: Urology

## 2018-04-01 DIAGNOSIS — R972 Elevated prostate specific antigen [PSA]: Secondary | ICD-10-CM | POA: Diagnosis present

## 2018-04-01 LAB — POCT I-STAT CREATININE: CREATININE: 0.8 mg/dL (ref 0.61–1.24)

## 2018-04-01 MED ORDER — GADOBUTROL 1 MMOL/ML IV SOLN
10.0000 mL | Freq: Once | INTRAVENOUS | Status: AC | PRN
  Administered 2018-04-01: 10 mL via INTRAVENOUS

## 2019-09-23 IMAGING — MR MR PROSTATE WO/W CM
23 of 56 series · 23 of 56 positions shown · IV contrast (gadavist)
Comparison: Biopsy results of 09/11/2016

CLINICAL DATA: Elevated PSA.

EXAM:
MR PROSTATE WITHOUT AND WITH CONTRAST
TECHNIQUE: Multiplanar multisequence MRI images were obtained of the pelvis
centered about the prostate. Pre and post contrast images were
obtained.
CONTRAST:  10 cc of Gadavist

[Series 3: bSSFP fat-sat · axial · 6.0mm · 0.86mm/px · 1 of 44 slices shown]
[im 1/44]
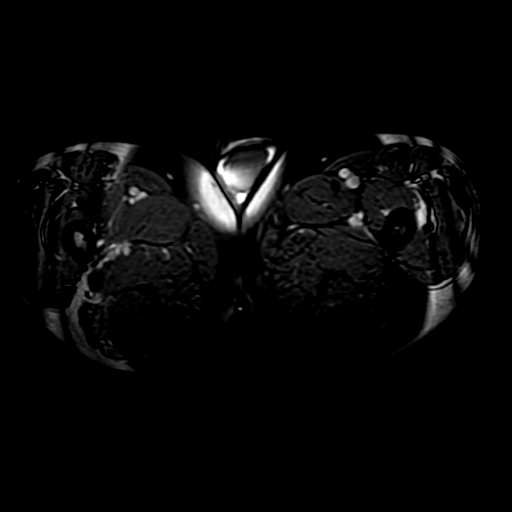

[Series 4: T1 · axial · 6.0mm · 0.86mm/px · 1 of 44 slices shown (1 of 2)]
[im 1/44]
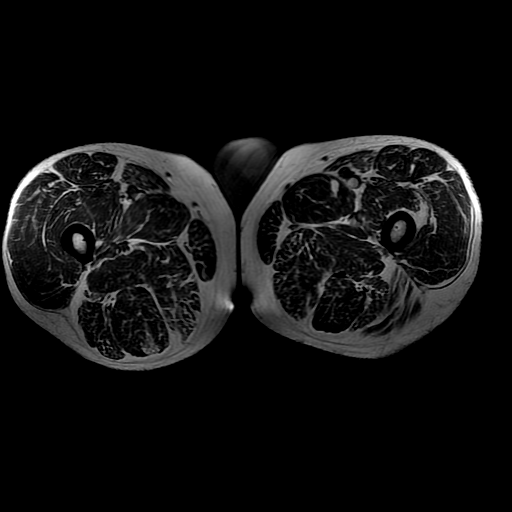

[Series 5: T2 · axial · 3.0mm · 0.29mm/px · 1 of 30 slices shown (1 of 5)]
[im 1/30]
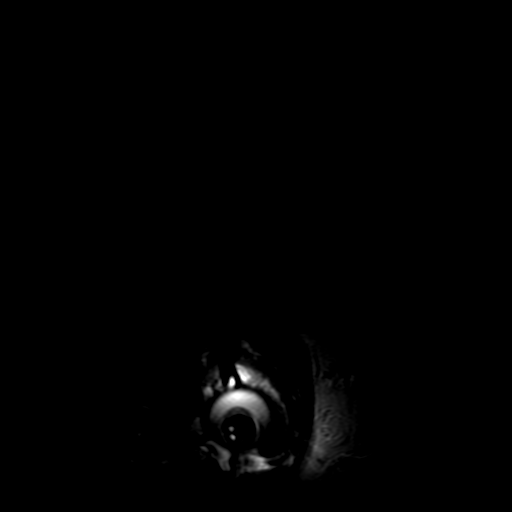

[Series 6: T1 · axial · 3.0mm · 0.29mm/px · 1 of 30 slices shown (2 of 2)]
[im 1/30]
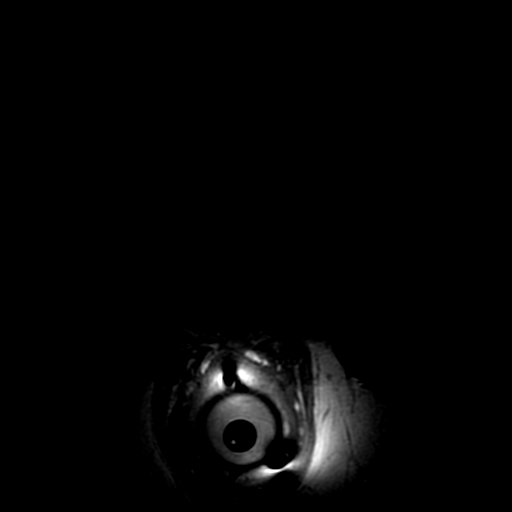

[Series 7: T2 · axial · 3.0mm · 0.31mm/px · 1 of 30 slices shown (2 of 5)]
[im 1/30]
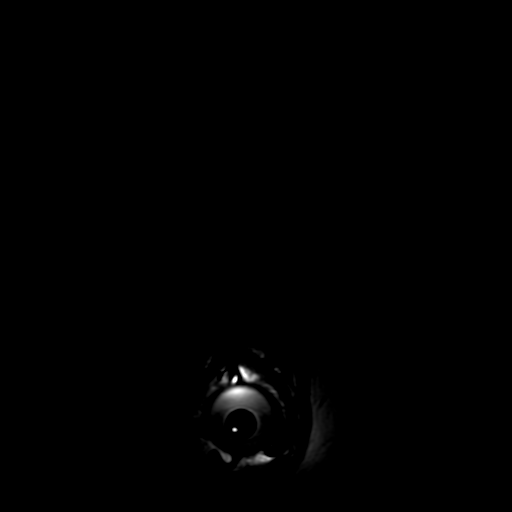

[Series 8: T2 · axial · 1.8mm · 0.47mm/px · 1 of 156 slices shown (3 of 5)]
[im 1/156]
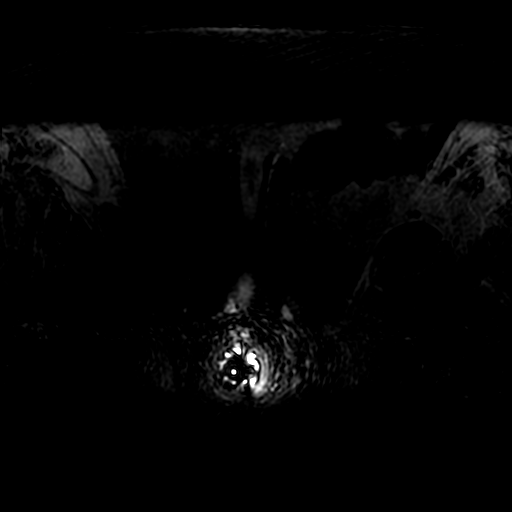

[Series 9: T2 · sagittal · 4.0mm · 0.29mm/px · 1 of 24 slices shown (4 of 5)]
[im 1/24]
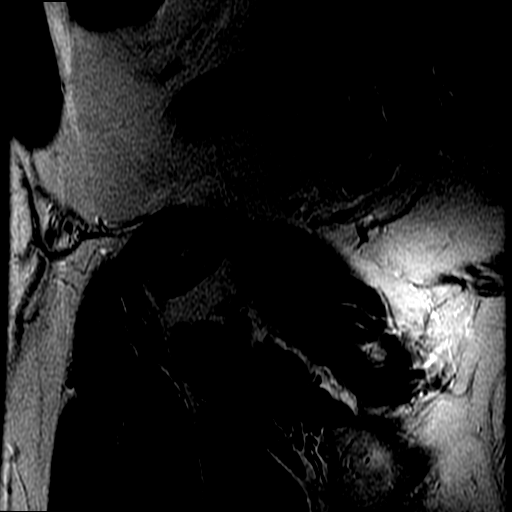

[Series 10: T2 · coronal · 4.0mm · 0.29mm/px · 1 of 24 slices shown (5 of 5)]
[im 1/24]
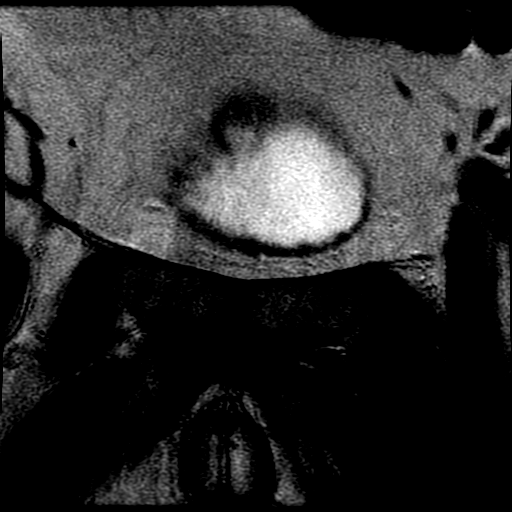

[Series 11: DWI · axial · 3.0mm · 0.59mm/px · 1 of 56 slices shown (1 of 6)]
[im 1/56]
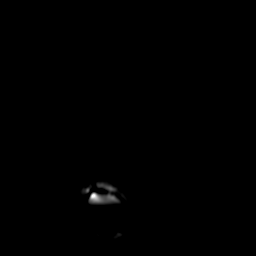

[Series 12: DWI · axial · 3.0mm · 0.59mm/px · 1 of 56 slices shown (2 of 6)]
[im 1/56]
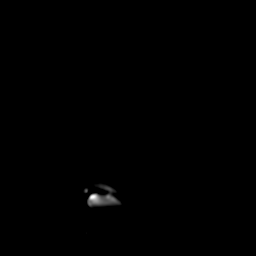

[Series 14: DWI · axial · 3.0mm · 0.59mm/px · 1 of 55 slices shown (3 of 6)]
[im 1/55]
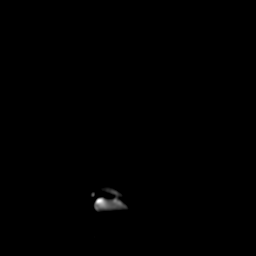

[Series 800: reformatted · coronal · 2.0mm · 0.39mm/px · 1 of 69 slices shown (1 of 2)]
[im 1/69]
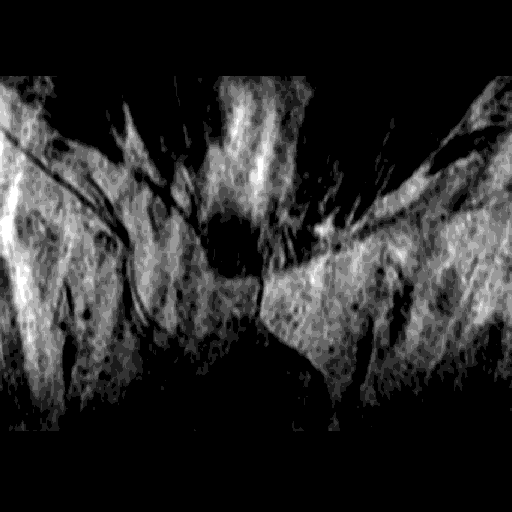

[Series 801: reformatted · sagittal · 2.0mm · 0.43mm/px · 1 of 63 slices shown (2 of 2)]
[im 1/63]
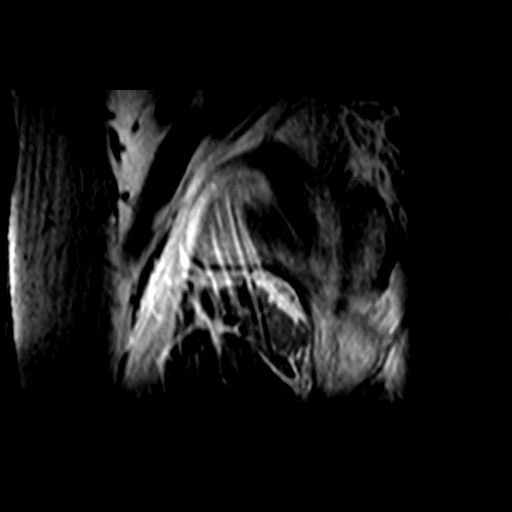

[Series 1100: DWI · axial · 3.0mm · 0.59mm/px · 1 of 28 slices shown (4 of 6)]
[im 1/28]
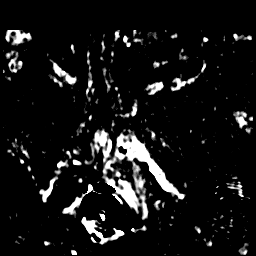

[Series 1200: DWI · axial · 3.0mm · 0.59mm/px · 1 of 28 slices shown (5 of 6)]
[im 1/28]
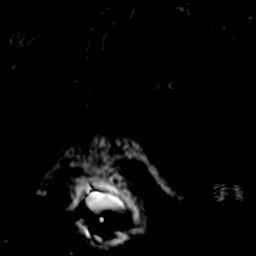

[Series 1400: DWI · axial · 3.0mm · 0.59mm/px · 1 of 28 slices shown (6 of 6)]
[im 1/28]
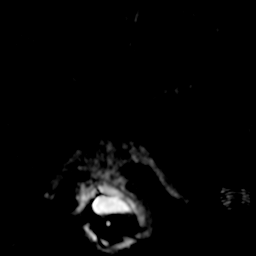

[((id)/(id)/1)-((id)/(id)/1) · axial · 3.0mm · 0.43mm/px · 1 of 76 slices shown (1 of 7)]
[im 1/76]
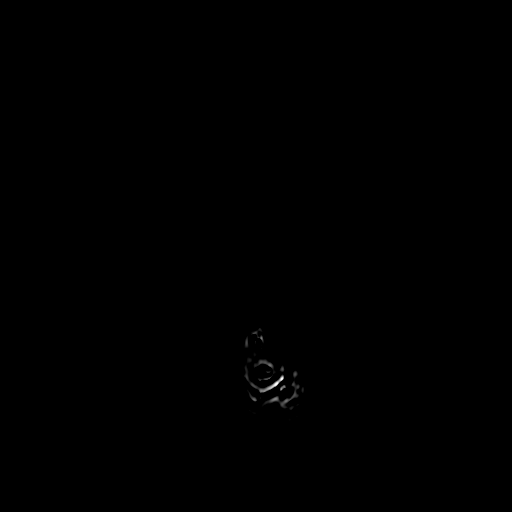

[((id)/(id)/1)-((id)/(id)/1) · axial · 3.0mm · 0.43mm/px · 1 of 76 slices shown (2 of 7)]
[im 1/76]
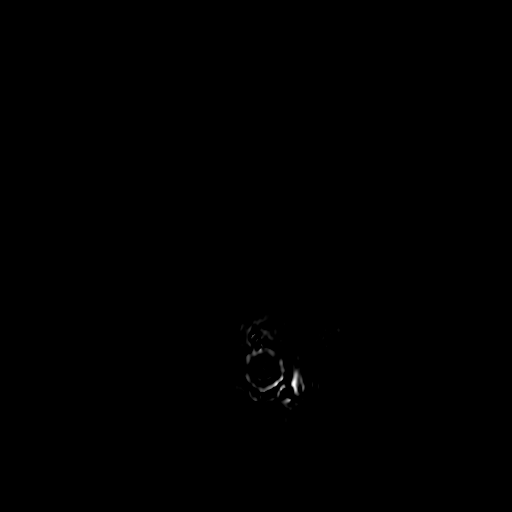

[((id)/(id)/1)-((id)/(id)/1) · axial · 3.0mm · 0.43mm/px · 1 of 76 slices shown (3 of 7)]
[im 1/76]
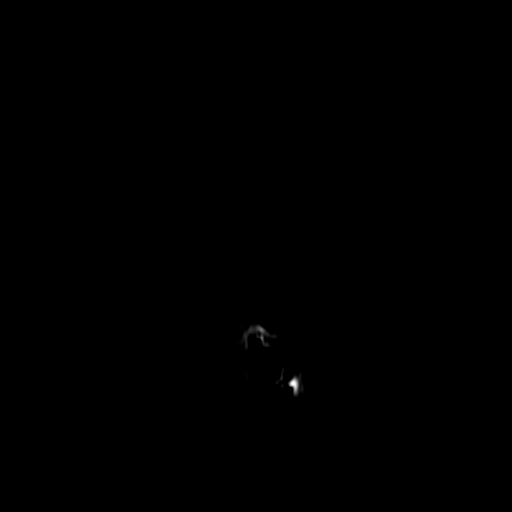

[((id)/(id)/1)-((id)/(id)/1) · axial · 3.0mm · 0.43mm/px · 1 of 76 slices shown (4 of 7)]
[im 1/76]
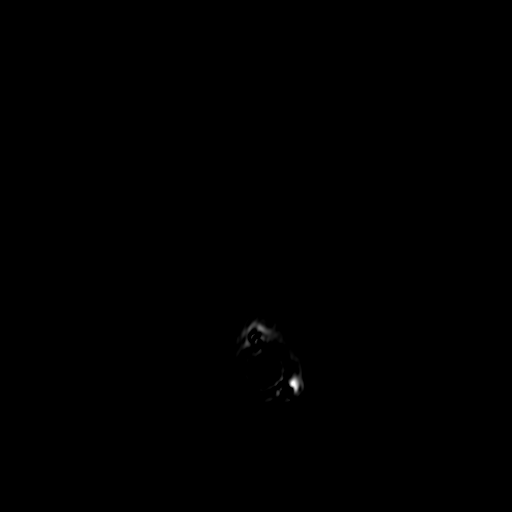

[((id)/(id)/1)-((id)/(id)/1) · axial · 3.0mm · 0.43mm/px · 1 of 76 slices shown (5 of 7)]
[im 1/76]
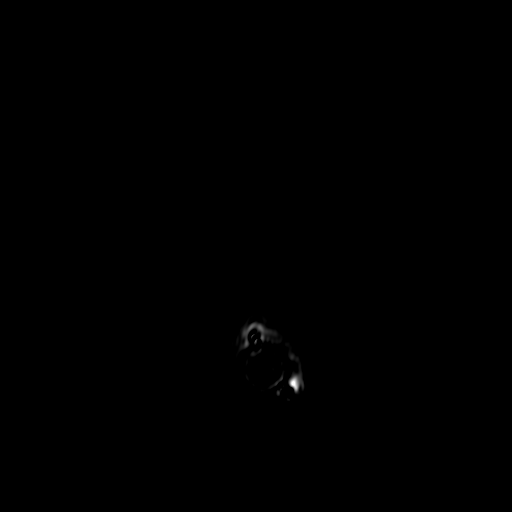

[((id)/(id)/1)-((id)/(id)/1) · axial · 3.0mm · 0.43mm/px · 1 of 76 slices shown (6 of 7)]
[im 1/76]
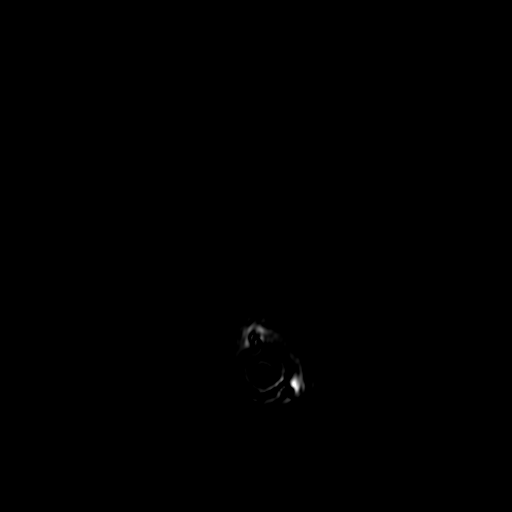

[((id)/(id)/1)-((id)/(id)/1) · axial · 3.0mm · 0.43mm/px · 1 of 76 slices shown (7 of 7)]
[im 1/76]
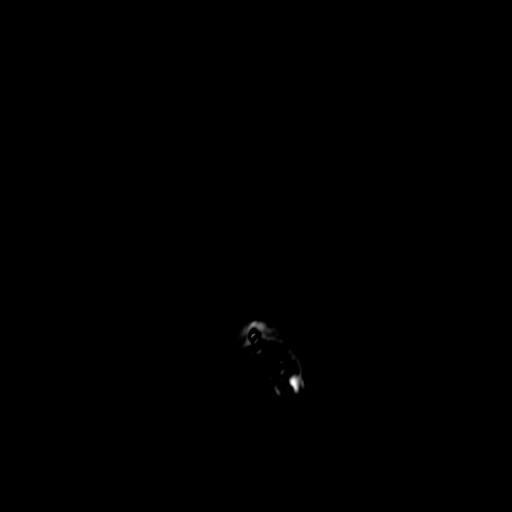

[23 of 56 positions shown; findings below may reference images not displayed]

FINDINGS: Mild degradation secondary to right hip arthroplasty and resultant
susceptibility artifact. There is also motion degradation, most
significant involving the dynamic pre and post contrast series.

Prostate: Moderate central gland enlargement and heterogeneity,
consistent with benign prostatic hyperplasia. No suspicious central
gland nodule.

An approximately 3 mm focus of vague T2 hypointensity involves the
lateral left peripheral zone at the base including on image [DATE] and
image [DATE]. This is without correlate restricted diffusion or early
post-contrast enhancement. Peripheral zone is otherwise mildly
compressed, without suspicious abnormality.

Volume: 3.9 x 5.7 x 5.2 cm (volume = 61 cm^3)

Transcapsular spread:  Absent

Seminal vesicle involvement: Absent

Neurovascular bundle involvement: Absent

Pelvic adenopathy: Absent

Bone metastasis: Absent

Other findings: Right hip arthroplasty. Fat containing right
inguinal hernia is small.
IMPRESSION: 1. Mild limitations, as detailed above.
2. 3 mm focus of left lateral peripheral zone vague T2 hypointensity
at the base. Given absence of restricted diffusion or early
post-contrast enhancement, [REDACTED]
